# Patient Record
Sex: Female | Born: 1985 | Marital: Single | Smoking: Never smoker
Health system: Southern US, Community
[De-identification: ages and names within clinical notes are randomized; demographics above are authoritative.]

## PROBLEM LIST (undated history)

## (undated) DIAGNOSIS — F419 Anxiety disorder, unspecified: Secondary | ICD-10-CM

## (undated) DIAGNOSIS — F509 Eating disorder, unspecified: Secondary | ICD-10-CM

## (undated) DIAGNOSIS — J45909 Unspecified asthma, uncomplicated: Secondary | ICD-10-CM

## (undated) HISTORY — DX: Anxiety disorder, unspecified: F41.9

## (undated) HISTORY — DX: Eating disorder, unspecified: F50.9

## (undated) HISTORY — PX: LABRAL REPAIR: SHX5172

## (undated) HISTORY — DX: Unspecified asthma, uncomplicated: J45.909

---
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
## 2007-06-07 LAB — LIPID PANEL
HDL: 60 mg/dL (ref 35–70)
LDL Cholesterol: 85 mg/dL
Triglycerides: 42 mg/dL (ref 40–160)

## 2012-05-02 ENCOUNTER — Encounter: Payer: Self-pay | Admitting: Family Medicine

## 2012-05-02 ENCOUNTER — Ambulatory Visit (INDEPENDENT_AMBULATORY_CARE_PROVIDER_SITE_OTHER): Payer: PRIVATE HEALTH INSURANCE | Admitting: Family Medicine

## 2012-05-02 VITALS — BP 101/70 | HR 60 | Temp 98.7°F | Ht 61.0 in | Wt 106.2 lb

## 2012-05-02 DIAGNOSIS — F419 Anxiety disorder, unspecified: Secondary | ICD-10-CM

## 2012-05-02 DIAGNOSIS — F509 Eating disorder, unspecified: Secondary | ICD-10-CM

## 2012-05-02 DIAGNOSIS — J45909 Unspecified asthma, uncomplicated: Secondary | ICD-10-CM | POA: Insufficient documentation

## 2012-05-02 DIAGNOSIS — F411 Generalized anxiety disorder: Secondary | ICD-10-CM

## 2012-05-02 NOTE — Progress Notes (Signed)
  Subjective:    Patient ID: Angel Benitez, female    DOB: [birth year], 26 y.o.   MRN: [id number]  HPI New to establish.  Recently moved.  GYN- [facility]  Birth Control- currently taking OCPs, has appt next week for Mirena.  Anxiety- chronic problem, currently on Zoloft (started in March 2013).  Was having relationship stressors, job stressors.  These situations have improved and pt doesn't want to be chronically on meds.  Has tendency to get 'very overwhelmed at times'.  Has some OCD tendencies w/ food and exercise.  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  Does not have scale in the house due to OCD tendencies.  Feels sxs are well controlled.  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  Had RAD, using Albuterol daily, on Flovent.  Typically only flares when ill.   Review of Systems For ROS see HPI     Objective:   Physical Exam  Vitals reviewed. Constitutional: She is oriented to person, place, and time. She appears well-developed and well-nourished. No distress.  HENT:  Head: Normocephalic and atraumatic.  Neck: Normal range of motion. Neck supple. No thyromegaly present.  Cardiovascular: Normal rate, regular rhythm, normal heart sounds and intact distal pulses.   No murmur heard. Pulmonary/Chest: Effort normal and breath sounds normal. No respiratory distress. She has no wheezes. She has no rales.  Lymphadenopathy:    She has no cervical adenopathy.  Neurological: She is alert and oriented to person, place, and time.  Skin: Skin is warm and dry.  Psychiatric: She has a normal mood and affect. Her behavior is normal. Judgment and thought content normal.          Assessment & Plan:

## 2012-05-02 NOTE — Assessment & Plan Note (Signed)
New to provider, chronic for pt.  Currently on Zoloft.  Discussed weaning.  Since pt is getting IUD next week and will stop her OCPs, it is best to wait until her hormones normalize before decreasing dose.  Pt in agreement w/ this.  Will start 50mg  after New Year w/ plan to wean off.  Will follow.

## 2012-05-02 NOTE — Patient Instructions (Addendum)
Schedule your complete physical for Feb Plan on decreasing your Zoloft to 1/2 tab (50 mg) the first of the year Call with any questions or concerns Welcome!  We're glad to have you!

## 2012-05-02 NOTE — Assessment & Plan Note (Signed)
New to provider.  Chronic for pt.  Admits that she is 'better' but continues to struggle w/ body image, food, and weight.  Pt to inform me if sxs worsen- will enlist help of counseling prn.  Pt expressed understanding and is in agreement w/ plan.

## 2012-05-02 NOTE — Assessment & Plan Note (Signed)
New to provider.  Pt had exercise induced asthma as a child but has outgrown this for the most part.  Had severe flare of RAD due to illness over the winter but is not currently requiring any inhaler use.  Will continue to follow and tx prn.  Pt expressed understanding and is in agreement w/ plan.

## 2012-09-17 ENCOUNTER — Encounter: Payer: PRIVATE HEALTH INSURANCE | Admitting: Family Medicine

## 2012-10-04 ENCOUNTER — Ambulatory Visit (INDEPENDENT_AMBULATORY_CARE_PROVIDER_SITE_OTHER): Payer: PRIVATE HEALTH INSURANCE | Admitting: Family Medicine

## 2012-10-04 ENCOUNTER — Encounter: Payer: Self-pay | Admitting: Family Medicine

## 2012-10-04 VITALS — BP 117/70 | HR 42 | Ht 61.0 in | Wt 113.0 lb

## 2012-10-04 DIAGNOSIS — E785 Hyperlipidemia, unspecified: Secondary | ICD-10-CM | POA: Insufficient documentation

## 2012-10-04 NOTE — Progress Notes (Signed)
CC: Angel Benitez is a 27 y.o. 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  History of anxiety: This was at its worst one year ago while she was living with her mother and working in a job she disliked and while her boyfriend was many states away. She describes her symptoms solely as anxiousness that was moderate in severity everyday the week. She was put on Zoloft but do to a recent move to [place] with her boyfriend she tapered herself off of it about a month ago. She is quite happy with her living situation right now and denies any anxiety. She describes herself as a perfectionist but denies symptoms that would classify her as having excessive compulsive disorder. She denies depression, paranoia, mental disturbance.  History of hyperlipidemia: She's unsure what her most recent panel revealed however she was once told that her cholesterol is elevated, fortunately she was able to control this with diet and exercise alone. She describes herself as an exercise freak. She exercises everyday of the week.  She has a family history of type 2 diabetes: She does not believe she's never been screened for diabetes. She denies polyphagia polyuria nor polydipsia   Review of Systems - General ROS: negative for - chills, fever, night sweats, weight gain or weight loss Ophthalmic ROS: negative for - decreased vision Psychological ROS: negative for - anxiety or depression ENT ROS: negative for - hearing change, nasal congestion, tinnitus or allergies Hematological and Lymphatic ROS: negative for - bleeding problems, bruising or swollen lymph nodes Breast ROS: negative Respiratory ROS: no cough, shortness of breath, or wheezing Cardiovascular ROS: no chest pain or dyspnea on exertion Gastrointestinal ROS: no abdominal pain, change in bowel habits, or black or bloody  stools Genito-Urinary ROS: negative for - genital discharge, genital ulcers, incontinence or abnormal bleeding from genitals Musculoskeletal ROS: negative for - joint pain or muscle pain Neurological ROS: negative for - headaches or memory loss Dermatological ROS: negative for lumps, mole changes, rash and skin lesion changes  Past Medical History  Diagnosis Date  . Asthma   . Depression   . Eating disorder      Family History  Problem Relation Age of Onset  . Alcohol abuse Father   . Drug abuse Father   . Drug abuse Maternal Aunt   . Alcohol abuse Maternal Aunt   . Mental illness Maternal Aunt   . Breast cancer Maternal Grandmother   . Diabetes Maternal Grandmother   . Heart disease Paternal Grandfather      History  Substance Use Topics  . Smoking status: Never Smoker   . Smokeless tobacco: Not on file  . Alcohol Use: Yes     Comment: occasionally     Objective: Filed Vitals:   10/04/12 0849  BP: 117/70  Pulse: 42    General: Alert and Oriented, No Acute Distress HEENT: Pupils equal, round, reactive to light. Conjunctivae clear.  External ears unremarkable, canals clear with intact TMs with appropriate landmarks.  Middle ear appears open without effusion. Pink inferior turbinates.  Moist mucous membranes, pharynx without inflammation nor lesions.  Neck supple without palpable lymphadenopathy nor abnormal masses. Lungs: Clear to auscultation bilaterally, no wheezing/ronchi/rales.  Comfortable work of breathing. Good air movement. Cardiac: Regular rate and rhythm. Normal S1/S2.  No murmurs, rubs, nor gallops.   Extremities: No peripheral edema.  Strong peripheral pulses.  Mental Status:  No depression, anxiety, nor agitation. Skin: Warm and dry.  Assessment & Plan: Bobbe was seen today for establish care.  Diagnoses and associated orders for this visit:  Hyperlipidemia LDL goal < 160 - Lipid panel  Anxiety  Diabetes mellitus screening - BASIC METABOLIC PANEL  WITH GFR  Other Orders  Anxiety: Controlled, continue daily exercise routine, discussed returning at her convenience should symptoms of anxiety return. Hyperlipidemia: Currently controlled however due for fasting lipid panel. We'll screen for type 2 diabetes basic metabolic panel    Return in about 3 months (around 01/01/2013).

## 2012-10-24 ENCOUNTER — Encounter: Payer: Self-pay | Admitting: Family Medicine

## 2012-10-24 DIAGNOSIS — J45909 Unspecified asthma, uncomplicated: Secondary | ICD-10-CM

## 2012-10-25 ENCOUNTER — Encounter: Payer: Self-pay | Admitting: *Deleted

## 2012-11-08 ENCOUNTER — Encounter: Payer: Self-pay | Admitting: Family Medicine

## 2012-11-26 ENCOUNTER — Encounter: Payer: PRIVATE HEALTH INSURANCE | Admitting: Family Medicine

## 2012-12-10 ENCOUNTER — Encounter: Payer: Self-pay | Admitting: Sports Medicine

## 2012-12-10 ENCOUNTER — Ambulatory Visit (INDEPENDENT_AMBULATORY_CARE_PROVIDER_SITE_OTHER): Payer: PRIVATE HEALTH INSURANCE | Admitting: Sports Medicine

## 2012-12-10 VITALS — BP 113/70 | HR 61 | Wt 110.0 lb

## 2012-12-10 DIAGNOSIS — R112 Nausea with vomiting, unspecified: Secondary | ICD-10-CM

## 2012-12-10 DIAGNOSIS — R1013 Epigastric pain: Secondary | ICD-10-CM

## 2012-12-10 LAB — COMPREHENSIVE METABOLIC PANEL WITH GFR
ALT: 31 U/L (ref 0–35)
Albumin: 5 g/dL (ref 3.5–5.2)
CO2: 28 meq/L (ref 19–32)
Calcium: 9.7 mg/dL (ref 8.4–10.5)
Chloride: 100 meq/L (ref 96–112)
Glucose, Bld: 86 mg/dL (ref 70–99)
Sodium: 138 meq/L (ref 135–145)
Total Protein: 7.2 g/dL (ref 6.0–8.3)

## 2012-12-10 LAB — LIPID PANEL
LDL Cholesterol: 78 mg/dL (ref 0–99)
VLDL: 8 mg/dL (ref 0–40)

## 2012-12-10 LAB — BASIC METABOLIC PANEL WITH GFR
Chloride: 102 mEq/L (ref 96–112)
Creat: 1.12 mg/dL — ABNORMAL HIGH (ref 0.50–1.10)
GFR, Est Non African American: 68 mL/min
Potassium: 4.7 mEq/L (ref 3.5–5.3)

## 2012-12-10 LAB — CBC WITH DIFFERENTIAL/PLATELET
Basophils Absolute: 0 10*3/uL (ref 0.0–0.1)
Basophils Relative: 1 % (ref 0–1)
Eosinophils Absolute: 0 K/uL (ref 0.0–0.7)
Eosinophils Relative: 1 % (ref 0–5)
HCT: 44.1 % (ref 36.0–46.0)
Hemoglobin: 14.4 g/dL (ref 12.0–15.0)
Lymphocytes Relative: 24 % (ref 12–46)
Lymphs Abs: 1 K/uL (ref 0.7–4.0)
MCH: 28.6 pg (ref 26.0–34.0)
MCHC: 32.7 g/dL (ref 30.0–36.0)
MCV: 87.7 fL (ref 78.0–100.0)
Monocytes Absolute: 0.4 10*3/uL (ref 0.1–1.0)
Monocytes Relative: 10 % (ref 3–12)
Neutro Abs: 2.8 10*3/uL (ref 1.7–7.7)
Neutrophils Relative %: 66 % (ref 43–77)
Platelets: 205 K/uL (ref 150–400)
RBC: 5.03 MIL/uL (ref 3.87–5.11)
RDW: 14.3 % (ref 11.5–15.5)
WBC: 4.2 K/uL (ref 4.0–10.5)

## 2012-12-10 LAB — COMPREHENSIVE METABOLIC PANEL
AST: 52 U/L — ABNORMAL HIGH (ref 0–37)
Alkaline Phosphatase: 69 U/L (ref 39–117)
BUN: 24 mg/dL — ABNORMAL HIGH (ref 6–23)
Creat: 0.95 mg/dL (ref 0.50–1.10)
Potassium: 4.5 mEq/L (ref 3.5–5.3)
Total Bilirubin: 0.6 mg/dL (ref 0.3–1.2)

## 2012-12-10 LAB — URINALYSIS
Glucose, UA: NEGATIVE mg/dL
Hgb urine dipstick: NEGATIVE
Ketones, ur: 15 mg/dL — AB
Leukocytes, UA: NEGATIVE
Nitrite: NEGATIVE
Protein, ur: NEGATIVE mg/dL
Specific Gravity, Urine: 1.02 (ref 1.005–1.030)
Urobilinogen, UA: 0.2 mg/dL (ref 0.0–1.0)
pH: 6 (ref 5.0–8.0)

## 2012-12-10 LAB — PREGNANCY, URINE: Preg Test, Ur: NEGATIVE

## 2012-12-10 LAB — LIPASE: Lipase: 51 U/L (ref 0–75)

## 2012-12-10 MED ORDER — ONDANSETRON 8 MG PO TBDP: 8.0000 mg | ORAL_TABLET | Freq: Three times a day (TID) | ORAL | Status: DC | PRN

## 2012-12-10 MED ORDER — PROMETHAZINE HCL 25 MG PO TABS: 25.0000 mg | ORAL_TABLET | Freq: Four times a day (QID) | ORAL | Status: DC | PRN

## 2012-12-10 MED ORDER — TRAMADOL HCL 50 MG PO TABS: 50.0000 mg | ORAL_TABLET | Freq: Three times a day (TID) | ORAL | Status: DC | PRN

## 2012-12-10 MED ORDER — PROMETHAZINE HCL 25 MG/ML IJ SOLN
25.0000 mg | Freq: Once | INTRAMUSCULAR | Status: AC
  Administered 2012-12-10: 25 mg via INTRAMUSCULAR

## 2012-12-10 NOTE — Progress Notes (Signed)
  Subjective:    CC: Nausea  HPI: This is a very pleasant 27 year old female, she is the wife of one of the family medicine residents at [facility].  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  She's had several weeks of epigastric abdominal pain, nausea, single episode of vomiting, and mild diarrhea. Overall symptoms are getting better, but her epigastric pain has been persistent. It's fairly random, and not associated with food. Unfortunately, she has decreased her oral fluid intake and her oral food intake. She is still urinating, but is very dark. Pain is localized, does not radiate, moderate. She denies any fevers, chills, or upper respiratory symptoms. Denies any sick contacts. She does have Mirena in place.  Past medical history, Surgical history, Family history not pertinant except as noted below, Social history, Allergies, and medications have been entered into the medical record, reviewed, and no changes needed.   Review of Systems: No fevers, chills, night sweats, weight loss, chest pain, or shortness of breath.   Objective:    General: Well Developed, well nourished, and in no acute distress.  Neuro: Alert and oriented x3, extra-ocular muscles intact, sensation grossly intact.  HEENT: Normocephalic, atraumatic, pupils equal round reactive to light, neck supple, no masses, no lymphadenopathy, thyroid nonpalpable. Oropharynx shows dry mucous membranes, nasopharynx, external ear canals unremarkable. Skin: Warm and dry, no rashes. Cardiac: Regular rate and rhythm, no murmurs rubs or gallops, no lower extremity edema.  Respiratory: Clear to auscultation bilaterally. Not using accessory muscles, speaking in full sentences. Abdomen:  Soft, mildly tender in the epigastrium, no rigidity, no rebound pain, normal bowel sounds.    Promethazine 25 mg intramuscular given. Impression and Recommendations:

## 2012-12-10 NOTE — Assessment & Plan Note (Signed)
This is acute likely related to viral gastroenteritis. Phenergan 25 mg intramuscular. CBC with differential, CMET, lipase, urinalysis, urine hCG. Abdominal exam is fairly benign, I don't think we need a CT scan. Zofran ODT, Phenergan oral to go home with, tramadol for pain. Return if no better in one week.

## 2012-12-12 ENCOUNTER — Telehealth: Payer: Self-pay | Admitting: *Deleted

## 2012-12-12 NOTE — Telephone Encounter (Signed)
Pt called and states she received a phone call from Dr. Ivan Anchors about lab results.Initially I advised that I couldn't give her the results because the doctor had not technically signed off on it. Pt became furious and was upset because she called back for results and now she's "freaking out" because no one has the results and knows what's going on. She asked if I could just give her the numbers. 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 Karie Schwalbe to look at her Lipid profile and gave her her numbers after he reviewed it.

## 2013-02-27 ENCOUNTER — Telehealth: Payer: Self-pay | Admitting: Family Medicine

## 2013-02-28 ENCOUNTER — Telehealth: Payer: Self-pay | Admitting: Family Medicine

## 2013-02-28 NOTE — Telephone Encounter (Signed)
Pt called yesterday upset because she was charged for initial visit. Explained to her that copays are collected for new patient visits but not for cpe's. She said that she  mentioned to Dr that she had been treated in the past for Anxiety but was charged for it...wants to speak to Dr about this.

## 2013-04-01 ENCOUNTER — Encounter: Payer: PRIVATE HEALTH INSURANCE | Admitting: Family Medicine

## 2013-05-05 ENCOUNTER — Encounter: Payer: PRIVATE HEALTH INSURANCE | Admitting: Family Medicine

## 2013-05-12 ENCOUNTER — Encounter: Payer: PRIVATE HEALTH INSURANCE | Admitting: Family Medicine

## 2013-06-19 ENCOUNTER — Other Ambulatory Visit: Payer: Self-pay

## 2013-10-28 ENCOUNTER — Encounter: Payer: PRIVATE HEALTH INSURANCE | Admitting: Family Medicine

## 2013-12-23 ENCOUNTER — Encounter: Payer: PRIVATE HEALTH INSURANCE | Admitting: Family Medicine

## 2014-06-29 ENCOUNTER — Encounter: Payer: Self-pay | Admitting: Sports Medicine

## 2014-06-29 ENCOUNTER — Ambulatory Visit (INDEPENDENT_AMBULATORY_CARE_PROVIDER_SITE_OTHER): Payer: PRIVATE HEALTH INSURANCE | Admitting: Sports Medicine

## 2014-06-29 VITALS — BP 106/64 | Ht 61.0 in | Wt 120.0 lb

## 2014-06-29 DIAGNOSIS — M25552 Pain in left hip: Secondary | ICD-10-CM

## 2014-06-29 NOTE — Progress Notes (Signed)
   Subjective:    Patient ID: Angel Benitez, female    DOB: 10-Jul-1986, 28 y.o.   MRN: [id number]  HPI chief complaint: Left hip pain  Very pleasant 28 year old runner comes in today complaining of 6-7 weeks of left hip pain. Pain began insidiously while training for a marathon. She began to initially experience left groin pain which then became more diffuse around the hip. Pain then began to radiate down the left thigh. Positive "C" sign.She stopped running for 3 weeks and as a result her pain resolved. She started back running a few miles last week and her pain began to return. No pain with walking. Pain primarily with running. She is currently localizing most of her pain to her groin. No numbness or tingling. No history of previous stress fracture in her hip but she has had stress fractures in her left foot. She is not experiencing any pain at rest. She is hoping to run the [employer] [place] in January. 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 She has a surgical history significant for left ankle arthroscopy in 2007 for loose body removal. She also has a surgical history significant for left shoulder labral repair done in March of this year at [facility]. No prior hip surgeries. No low back pain. Past medical history reviewed Surgical history reviewed Medications reviewed Allergies reviewed Social history reviewed    Review of Systems     Objective:   Physical Exam Well-developed, fit appearing. No acute distress. Awake alert and oriented 3. Vital signs reviewed.  Left hip: Full active and passive range of motion.Positive FADIR. Mild pain with resisted hip flexion but not markedly. No bony or soft tissue tenderness to direct palpation. No pain with resisted hip abduction or adduction. Negative Thomas test. Positive hop test. Neurovascular intact distally. Walking without a limp.       Assessment & Plan:  Left hip pain worrisome for femoral neck  stress fracture-rule out femoral acetabular impingement   I will start with getting plain x-rays of her left hip. If unremarkable. I think we need to pursue an MRI arthrogram to rule out a femoral neck stress reaction or stress fracture as well as femoral acetabular impingement/ labral tear. Patient may continue to exercise as tolerated using pain as her guide but I've warned her about trying to push through pain while running. Unfortunately, I'm not sure that she will be able to get in the training necessary for her upcoming marathon in January. I will follow-up with her via telephone with the results of her x-rays and MRI arthrogram once available at which point we will delineate more definitive treatment.

## 2014-06-29 NOTE — Patient Instructions (Addendum)
MRI L HIP [facility] [address] MON 07/13/14 @ 11A ARRIVAL TIME IS 1045A [phone]  XRAYS CAN BE DONE ANYTIME WITHOUT AN APPT

## 2014-07-01 ENCOUNTER — Ambulatory Visit (HOSPITAL_COMMUNITY)
Admission: RE | Admit: 2014-07-01 | Discharge: 2014-07-01 | Disposition: A | Discharge status: Home / Self Care | Payer: PRIVATE HEALTH INSURANCE | Source: Ambulatory Visit | Attending: Sports Medicine | Admitting: Sports Medicine

## 2014-07-01 DIAGNOSIS — R1030 Lower abdominal pain, unspecified: Secondary | ICD-10-CM | POA: Diagnosis not present

## 2014-07-01 DIAGNOSIS — M25552 Pain in left hip: Secondary | ICD-10-CM | POA: Diagnosis not present

## 2014-07-10 ENCOUNTER — Other Ambulatory Visit (HOSPITAL_COMMUNITY): Payer: PRIVATE HEALTH INSURANCE

## 2014-07-10 ENCOUNTER — Ambulatory Visit (HOSPITAL_COMMUNITY): Payer: PRIVATE HEALTH INSURANCE

## 2014-07-13 ENCOUNTER — Ambulatory Visit (HOSPITAL_COMMUNITY)
Admission: RE | Admit: 2014-07-13 | Discharge: 2014-07-13 | Disposition: A | Discharge status: Home / Self Care | Payer: PRIVATE HEALTH INSURANCE | Source: Ambulatory Visit | Attending: Sports Medicine | Admitting: Sports Medicine

## 2014-07-13 DIAGNOSIS — M25552 Pain in left hip: Secondary | ICD-10-CM | POA: Insufficient documentation

## 2014-07-13 MED ORDER — IOHEXOL 300 MG/ML  SOLN
50.0000 mL | Freq: Once | INTRAMUSCULAR | Status: AC | PRN
  Administered 2014-07-13: 10 mL

## 2014-07-17 ENCOUNTER — Telehealth: Payer: Self-pay | Admitting: Sports Medicine

## 2014-07-17 NOTE — Telephone Encounter (Signed)
Melina SchoolsRobyn comes in today at my request to go over her MRI arthrogram results of her left hip. Arthrogram is unremarkable. No evidence of stress fracture. No signs of labral tear. No signs of acetabular impingement. It is a normal study. She is currently asymptomatic. I asked her to come in today so that I could evaluate her running gait and reevaluate her hip strength. Her pain was previously in her groin. She has had IT band issues with her left knee in the past as well. She tells me that prior to her hip pain she changed running shoes and is questioning whether or not that may be contributing to her symptoms.  Physical exam: Left hip shows full painless hip range of motion. Negative logroll. Neg FADIR. She does have moderate weakness with both hip flexion and hip abduction bilaterally. No leg length discrepancy. Iliotibial band does not appear to be tight. In evaluating her running form she appears to be a midfoot striker and internally rotates both feet right greater than left. She may supinate slightly. Examination of her feet show a fairly neutral longitudinal arch and a collapsed transverse arch but no significant callus across the metatarsal heads.  Given her normal MRI arthrogram I have cleared Nayomi to return to running. I've given her a comprehensive hip strengthening program as well as some iliopsoas stretching. She will also start doing line drills at a local track twice a week. She is going to change back to her original running shoes Shon Baton(Brooks) and I've asked her to return to the office with those shoes sometime the next couple of weeks. I would also like to get Dr.Fields opinion.

## 2015-01-27 ENCOUNTER — Encounter: Payer: Self-pay | Admitting: Gastroenterology

## 2015-01-28 ENCOUNTER — Telehealth: Payer: Self-pay

## 2015-01-28 NOTE — Telephone Encounter (Signed)
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 Can you offer next Tuesday afternoon (double book) or Wednesday July 13 afternoon (double book).  Thanks

## 2015-01-28 NOTE — Telephone Encounter (Signed)
Pt has epigastric pain that wakes her up at night.  Scheduled for 02/24/15 with Dr Christella Hartigan

## 2015-01-28 NOTE — Telephone Encounter (Signed)
Left message on machine to call back  

## 2015-02-02 ENCOUNTER — Encounter: Payer: Self-pay | Admitting: Gastroenterology

## 2015-02-02 ENCOUNTER — Ambulatory Visit (INDEPENDENT_AMBULATORY_CARE_PROVIDER_SITE_OTHER): Payer: PRIVATE HEALTH INSURANCE | Admitting: Gastroenterology

## 2015-02-02 ENCOUNTER — Other Ambulatory Visit (INDEPENDENT_AMBULATORY_CARE_PROVIDER_SITE_OTHER): Payer: PRIVATE HEALTH INSURANCE

## 2015-02-02 VITALS — BP 110/64 | HR 60 | Ht 60.63 in | Wt 126.5 lb

## 2015-02-02 DIAGNOSIS — R1013 Epigastric pain: Secondary | ICD-10-CM

## 2015-02-02 LAB — CBC WITH DIFFERENTIAL/PLATELET
BASOS ABS: 0 10*3/uL (ref 0.0–0.1)
Basophils Relative: 0.8 % (ref 0.0–3.0)
Eosinophils Absolute: 0.2 10*3/uL (ref 0.0–0.7)
Eosinophils Relative: 3.2 % (ref 0.0–5.0)
HEMATOCRIT: 39.9 % (ref 36.0–46.0)
Hemoglobin: 13.4 g/dL (ref 12.0–15.0)
Lymphocytes Relative: 27.8 % (ref 12.0–46.0)
Lymphs Abs: 1.6 10*3/uL (ref 0.7–4.0)
MCHC: 33.6 g/dL (ref 30.0–36.0)
MCV: 87.4 fl (ref 78.0–100.0)
MONO ABS: 0.5 10*3/uL (ref 0.1–1.0)
Monocytes Relative: 8.9 % (ref 3.0–12.0)
Neutro Abs: 3.3 10*3/uL (ref 1.4–7.7)
Neutrophils Relative %: 59.3 % (ref 43.0–77.0)
Platelets: 235 10*3/uL (ref 150.0–400.0)
RBC: 4.56 Mil/uL (ref 3.87–5.11)
RDW: 13.7 % (ref 11.5–15.5)
WBC: 5.6 10*3/uL (ref 4.0–10.5)

## 2015-02-02 LAB — COMPREHENSIVE METABOLIC PANEL
ALT: 24 U/L (ref 0–35)
AST: 30 U/L (ref 0–37)
Albumin: 4.4 g/dL (ref 3.5–5.2)
Alkaline Phosphatase: 67 U/L (ref 39–117)
BILIRUBIN TOTAL: 0.5 mg/dL (ref 0.2–1.2)
BUN: 19 mg/dL (ref 6–23)
CALCIUM: 9.7 mg/dL (ref 8.4–10.5)
CO2: 31 mEq/L (ref 19–32)
Chloride: 103 mEq/L (ref 96–112)
Creatinine, Ser: 0.8 mg/dL (ref 0.40–1.20)
GFR: 90.49 mL/min (ref >60.00)
Glucose, Bld: 89 mg/dL (ref 70–99)
POTASSIUM: 4.2 meq/L (ref 3.5–5.1)
Sodium: 138 mEq/L (ref 135–145)
TOTAL PROTEIN: 6.9 g/dL (ref 6.0–8.3)

## 2015-02-02 LAB — H. PYLORI ANTIBODY, IGG: H PYLORI IGG: NEGATIVE

## 2015-02-02 NOTE — Patient Instructions (Addendum)
You will be set up for an ultrasound for your epigastric pains. You will have labs checked today in the basement lab.  Please head down after you check out with the front desk  (cbc, cmet, urine pregnancy test, h. Pylori serology). Ultrasound at [facility] 02/05/2015 at 10:30 am. Arrive at 10:15 am. Nothing to eat or drink after Midnight the night before.

## 2015-02-02 NOTE — Progress Notes (Signed)
HPI: This is a very pleasant 29 year old woman  who called herself to make this appointment. 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  Chief complaint is epigastric pain  A week ago she had dinner, her usual healthy fare.  After some time epigastric abd pain. Took tums, took zantac without much improvement. Awoke that night with worsening pains.  She took oxycodone and it helped briefly but she awoken again. By the morning she felt overall much better. Was not particularly nauseas at the time.  Never took  Infrequent NSAIDs, she used to a long time ago.  Stable weight overall.  Her bowels seem to react to stress, looser with stress.  Her husband is FP MD, sports med.  Used to live in [place]   Clinically a bit lactose intolerant.  Review of systems: Pertinent positive and negative review of systems were noted in the above HPI section. Complete review of systems was performed and was otherwise normal.   Past Medical History  Diagnosis Date  . Asthma   . Anxiety   . Eating disorder     Past Surgical History  Procedure Laterality Date  . Umbilical hernia repair  2007  . Ankle surgery Left 2007  . Labral repair Left     Current Outpatient Prescriptions  Medication Sig Dispense Refill  . Calcium Carb-Cholecalciferol (CALCIUM-VITAMIN D) 600-400 MG-UNIT TABS Take 1 tablet by mouth daily.    Marland Kitchen levonorgestrel (MIRENA) 20 MCG/24HR IUD 1 each by Intrauterine route once. Placed Fall 2013     No current facility-administered medications for this visit.    Allergies as of 02/02/2015 - Review Complete 02/02/2015  Allergen Reaction Noted  . Dairy aid [lactase]  02/02/2015  . Mold extract [trichophyton]  02/02/2015  . Penicillins  05/02/2012    Family History  Problem Relation Age of Onset  . Alcohol abuse Father   . Drug abuse Father   . Drug abuse Maternal Aunt   . Alcohol abuse Maternal Aunt   . Mental illness Maternal Aunt   . Breast cancer Maternal  Grandmother   . Diabetes Maternal Grandmother   . Heart disease Paternal Grandfather   . Colon cancer Other     great grandfather  . Colon polyps Mother     ?  Marland Kitchen Irritable bowel syndrome Father     History   Social History  . Marital Status: Single    Spouse Name: N/A  . Number of Children: 0  . Years of Education: N/A   Occupational History  . physicians assistant    Social History Main Topics  . Smoking status: Never Smoker   . Smokeless tobacco: Never Used  . Alcohol Use: 0.0 oz/week    0 Standard drinks or equivalent per week     Comment: occasionally  . Drug Use: No  . Sexual Activity: Not on file   Other Topics Concern  . Not on file   Social History Narrative     Physical Exam: BP 110/64 mmHg  Pulse 60  Ht 5' 0.63" (1.54 m)  Wt 126 lb 8 oz (57.38 kg)  BMI 24.19 kg/m2 Constitutional: generally well-appearing Psychiatric: alert and oriented x3 Eyes: extraocular movements intact Mouth: oral pharynx moist, no lesions Neck: supple no lymphadenopathy Cardiovascular: heart regular rate and rhythm Lungs: clear to auscultation bilaterally Abdomen: soft, nontender, nondistended, no obvious ascites, no peritoneal signs, normal bowel sounds Extremities: no lower extremity edema bilaterally Skin: no lesions on visible extremities   Assessment  and plan: 29 y.o. female with  an episode of significant epigastric pain about 1 week ago  I am most suspicious of biliary colic, underlying gallbladder disease. She is probably at increased risk for H. pylori infection since she works in the emergency room but she has no other risk factors for ulcer disease and so I would put that a bit lower on the list of possibilities here. I would like to proceed with blood tests and imaging studies including a CBC, complete metabolic profile, urine pregnancy test, abdominal ultrasound. If the ultrasound shows clear gallstones disease and I would likely refer her to a general surgeon to  consider cholecystectomy. If the ultrasound and lab tests are otherwise unrevealing then I think upper endoscopy is the next step in evaluation.   Rob Bunting, MD [facility] 02/02/2015, 1:32 PM  Cc: Laren Boom, DO

## 2015-02-03 ENCOUNTER — Ambulatory Visit (HOSPITAL_COMMUNITY)
Admission: RE | Admit: 2015-02-03 | Discharge: 2015-02-03 | Disposition: A | Discharge status: Home / Self Care | Payer: PRIVATE HEALTH INSURANCE | Source: Ambulatory Visit | Attending: Gastroenterology | Admitting: Gastroenterology

## 2015-02-03 DIAGNOSIS — R109 Unspecified abdominal pain: Secondary | ICD-10-CM | POA: Insufficient documentation

## 2015-02-03 DIAGNOSIS — R1013 Epigastric pain: Secondary | ICD-10-CM

## 2015-02-03 LAB — PREGNANCY, URINE: Preg Test, Ur: NEGATIVE

## 2015-02-05 ENCOUNTER — Ambulatory Visit (HOSPITAL_COMMUNITY): Payer: PRIVATE HEALTH INSURANCE

## 2015-02-08 ENCOUNTER — Ambulatory Visit (AMBULATORY_SURGERY_CENTER): Payer: Self-pay | Admitting: *Deleted

## 2015-02-08 VITALS — Ht 60.5 in | Wt 126.0 lb

## 2015-02-08 DIAGNOSIS — G8929 Other chronic pain: Secondary | ICD-10-CM

## 2015-02-08 DIAGNOSIS — R1013 Epigastric pain: Secondary | ICD-10-CM

## 2015-02-08 NOTE — Progress Notes (Signed)
No egg or soy allergy. She has had lactose intolerance No issues with past sedation No diet pills No home 02 use No emmi video per pt.  Pt absolutely refused to get on the scale for a weight. She states she was just weighed a week ago in the office at 126lb and she has anxiety about scales and refused to weigh.

## 2015-02-17 ENCOUNTER — Encounter: Payer: Self-pay | Admitting: Gastroenterology

## 2015-02-17 ENCOUNTER — Ambulatory Visit: Payer: PRIVATE HEALTH INSURANCE | Admitting: Gastroenterology

## 2015-02-17 VITALS — BP 106/58 | HR 42 | Temp 97.3°F | Resp 18 | Ht 60.0 in | Wt 126.0 lb

## 2015-02-17 DIAGNOSIS — K2970 Gastritis, unspecified, without bleeding: Secondary | ICD-10-CM

## 2015-02-17 DIAGNOSIS — R1013 Epigastric pain: Secondary | ICD-10-CM | POA: Diagnosis not present

## 2015-02-17 DIAGNOSIS — R1084 Generalized abdominal pain: Secondary | ICD-10-CM

## 2015-02-17 MED ORDER — SODIUM CHLORIDE 0.9 % IV SOLN: 500.0000 mL | INTRAVENOUS | Status: DC

## 2015-02-17 NOTE — Progress Notes (Signed)
Patient awakening,vss,report to rn 

## 2015-02-17 NOTE — Progress Notes (Signed)
Called to room to assist during endoscopic procedure.  Patient ID and intended procedure confirmed with present staff. Received instructions for my participation in the procedure from the performing physician.  

## 2015-02-17 NOTE — Op Note (Signed)
[facility] [address] [place], [id number]   ENDOSCOPY PROCEDURE REPORT  PATIENT: Angel Benitez, Angel  MR#: [id number] BIRTHDATE: 12-27-1985 , 29  yrs. old GENDER: female ENDOSCOPIST: Rachael Feeaniel P Khristopher Kapaun, MD PROCEDURE DATE:  02/17/2015 PROCEDURE:  EGD w/ biopsy ASA CLASS:     Class I INDICATIONS:  abdominal pain (normal US, cbc, cmet). MEDICATIONS: Monitored anesthesia care and Propofol 150 mg IV TOPICAL ANESTHETIC: none  DESCRIPTION OF PROCEDURE: After the risks benefits and alternatives of the procedure were thoroughly explained, informed consent was obtained.  The LB [id number] endoscope was introduced through the mouth and advanced to the second portion of the duodenum , Without limitations.  The instrument was slowly withdrawn as the mucosa was fully examined.  There was very mild, non-specific distal gastritis.  This was biopsied and sent to pathology.  The examination was othewise normal.  Retroflexed views revealed no abnormalities.     The scope was then withdrawn from the patient and the procedure completed. COMPLICATIONS: There were no immediate complications.  ENDOSCOPIC IMPRESSION: There was very mild, non-specific distal gastritis.  This was biopsied and sent to pathology.  The examination was othewise normal  RECOMMENDATIONS: If biopsies show H.  pylori, you will be started on appropriate antibiotics.  If not, will observe clinically for recurrent significant abd pains and if they occur would consider HIDA scan to check for biliary dyskinesia.  eSigned:  Rachael Feeaniel P Addelyn Alleman, MD 02/17/2015 2:28 PM

## 2015-02-17 NOTE — Progress Notes (Signed)
No problems noted in the recovery room. Maw  Haynes Bastracy Ennis, RN went over discharge instructions with the pt.  maw

## 2015-02-17 NOTE — Progress Notes (Signed)
Recovery phase was began in the procedure room @ 14:27, waiting for a bay to recovery the pt in recovery room. maw

## 2015-02-17 NOTE — Patient Instructions (Signed)
Impressions/recommendations:  Gastritis (handout given) Await pathology results.  YOU HAD AN ENDOSCOPIC PROCEDURE TODAY AT THE [facility]:   Refer to the procedure report that was given to you for any specific questions about what was found during the examination.  If the procedure report does not answer your questions, please call your gastroenterologist to clarify.  If you requested that your care partner not be given the details of your procedure findings, then the procedure report has been included in a sealed envelope for you to review at your convenience later.  YOU SHOULD EXPECT: Some feelings of bloating in the abdomen. Passage of more gas than usual.  Walking can help get rid of the air that was put into your GI tract during the procedure and reduce the bloating. If you had a lower endoscopy (such as a colonoscopy or flexible sigmoidoscopy) you may notice spotting of blood in your stool or on the toilet paper. If you underwent a bowel prep for your procedure, you may not have a normal bowel movement for a few days.  Please Note:  You might notice some irritation and congestion in your nose or some drainage.  This is from the oxygen used during your procedure.  There is no need for concern and it should clear up in a day or so.  SYMPTOMS TO REPORT IMMEDIATELY:   Following lower endoscopy (colonoscopy or flexible sigmoidoscopy):  Excessive amounts of blood in the stool  Significant tenderness or worsening of abdominal pains  Swelling of the abdomen that is new, acute  Fever of 100F or higher   Following upper endoscopy (EGD)  Vomiting of blood or coffee ground material  New chest pain or pain under the shoulder blades  Painful or persistently difficult swallowing  New shortness of breath  Fever of 100F or higher  Black, tarry-looking stools  For urgent or emergent issues, a gastroenterologist can be reached at any hour by calling [phone].   DIET: Your  first meal following the procedure should be a small meal and then it is ok to progress to your normal diet. Heavy or fried foods are harder to digest and may make you feel nauseous or bloated.  Likewise, meals heavy in dairy and vegetables can increase bloating.  Drink plenty of fluids but you should avoid alcoholic beverages for 24 hours.  ACTIVITY:  You should plan to take it easy for the rest of today and you should NOT DRIVE or use heavy machinery until tomorrow (because of the sedation medicines used during the test).    FOLLOW UP: Our staff will call the number listed on your records the next business day following your procedure to check on you and address any questions or concerns that you may have regarding the information given to you following your procedure. If we do not reach you, we will leave a message.  However, if you are feeling well and you are not experiencing any problems, there is no need to return our call.  We will assume that you have returned to your regular daily activities without incident.  If any biopsies were taken you will be contacted by phone or by letter within the next 1-3 weeks.  Please call us at [phone] if you have not heard about the biopsies in 3 weeks.    SIGNATURES/CONFIDENTIALITY: You and/or your care partner have signed paperwork which will be entered into your electronic medical record.  These signatures attest to the fact that that the information  above on your After Visit Summary has been reviewed and is understood.  Full responsibility of the confidentiality of this discharge information lies with you and/or your care-partner.YOU HAD AN ENDOSCOPIC PROCEDURE TODAY AT THE [facility]:   Refer to the procedure report that was given to you for any specific questions about what was found during the examination.  If the procedure report does not answer your questions, please call your gastroenterologist to clarify.  If you requested that your  care partner not be given the details of your procedure findings, then the procedure report has been included in a sealed envelope for you to review at your convenience later.  YOU SHOULD EXPECT: Some feelings of bloating in the abdomen. Passage of more gas than usual.  Walking can help get rid of the air that was put into your GI tract during the procedure and reduce the bloating. If you had a lower endoscopy (such as a colonoscopy or flexible sigmoidoscopy) you may notice spotting of blood in your stool or on the toilet paper. If you underwent a bowel prep for your procedure, you may not have a normal bowel movement for a few days.  Please Note:  You might notice some irritation and congestion in your nose or some drainage.  This is from the oxygen used during your procedure.  There is no need for concern and it should clear up in a day or so.  SYMPTOMS TO REPORT IMMEDIATELY:   Following upper endoscopy (EGD)  Vomiting of blood or coffee ground material  New chest pain or pain under the shoulder blades  Painful or persistently difficult swallowing  New shortness of breath  Fever of 100F or higher  Black, tarry-looking stools  For urgent or emergent issues, a gastroenterologist can be reached at any hour by calling [phone].   DIET: Your first meal following the procedure should be a small meal and then it is ok to progress to your normal diet. Heavy or fried foods are harder to digest and may make you feel nauseous or bloated.  Likewise, meals heavy in dairy and vegetables can increase bloating.  Drink plenty of fluids but you should avoid alcoholic beverages for 24 hours.  ACTIVITY:  You should plan to take it easy for the rest of today and you should NOT DRIVE or use heavy machinery until tomorrow (because of the sedation medicines used during the test).    FOLLOW UP: Our staff will call the number listed on your records the next business day following your procedure to check on  you and address any questions or concerns that you may have regarding the information given to you following your procedure. If we do not reach you, we will leave a message.  However, if you are feeling well and you are not experiencing any problems, there is no need to return our call.  We will assume that you have returned to your regular daily activities without incident.  If any biopsies were taken you will be contacted by phone or by letter within the next 1-3 weeks.  Please call us at [phone] if you have not heard about the biopsies in 3 weeks.    SIGNATURES/CONFIDENTIALITY: You and/or your care partner have signed paperwork which will be entered into your electronic medical record.  These signatures attest to the fact that that the information above on your After Visit Summary has been reviewed and is understood.  Full responsibility of the confidentiality of this discharge  information lies with you and/or your care-partner. 

## 2015-02-18 ENCOUNTER — Telehealth: Payer: Self-pay | Admitting: *Deleted

## 2015-02-18 NOTE — Telephone Encounter (Signed)
Name identifier, left message, follow-up 

## 2015-02-24 ENCOUNTER — Ambulatory Visit: Payer: PRIVATE HEALTH INSURANCE | Admitting: Gastroenterology

## 2015-03-12 ENCOUNTER — Encounter: Payer: PRIVATE HEALTH INSURANCE | Admitting: Gastroenterology

## 2015-04-13 ENCOUNTER — Ambulatory Visit: Payer: PRIVATE HEALTH INSURANCE | Admitting: Gastroenterology

## 2015-06-09 ENCOUNTER — Telehealth: Payer: Self-pay | Admitting: Sports Medicine

## 2015-06-09 NOTE — Telephone Encounter (Signed)
Angel Benitez called me yesterday with a complaint of congestion, cough, and bloody nasal discharge. She has a history of sinusitis/rhinitis/bronchitis with seasonal changes. She usually responds well to azithromycin. I've agreed to prescribe her a Z-Pak with instructions to take as directed. I'll also give her a prescription for Tussionex to take as needed for cough. If symptoms persist she will need to come into the office for a formal evaluation.

## 2017-01-11 IMAGING — US US ABDOMEN COMPLETE
1 series · 14 of 25 positions shown · non-contrast
Comparison: None.

CLINICAL DATA: Abdominal pain .

EXAM:
ULTRASOUND ABDOMEN COMPLETE

[Series 1: us abdomen complete · 0.18mm/px · 14 of 80 slices shown]
[im 1/80]
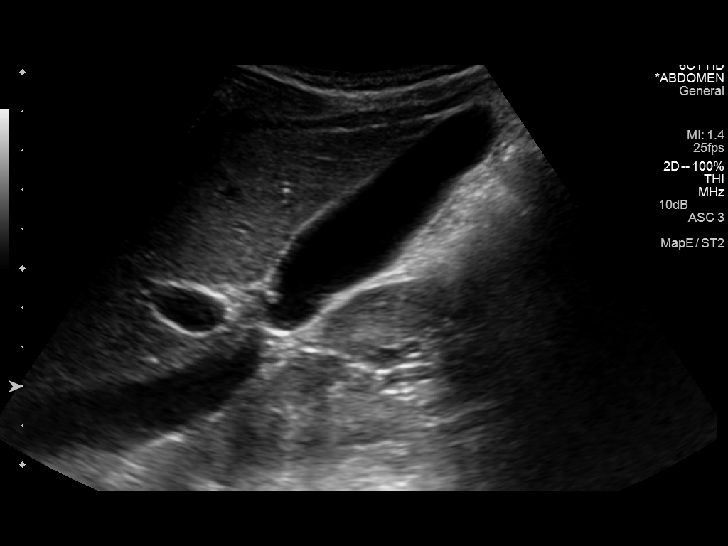
[im 7/80]
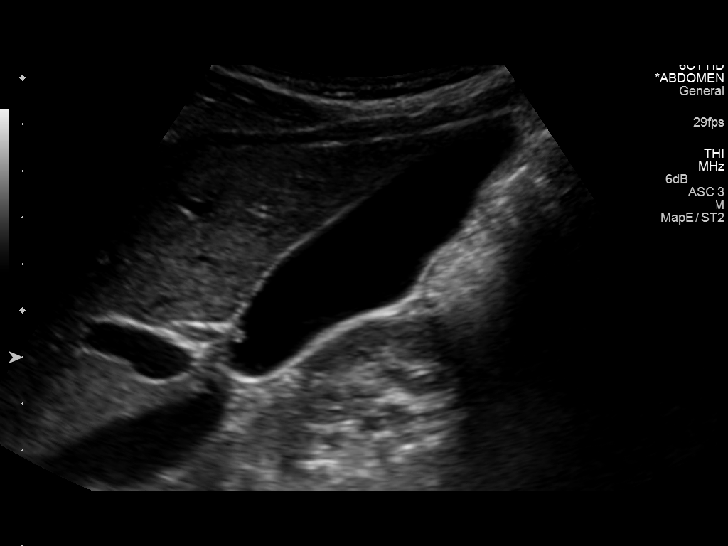
[im 14/80]
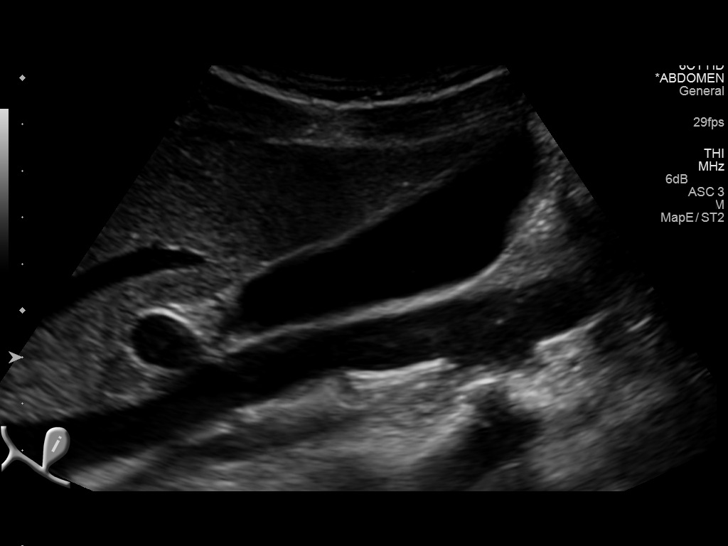
[im 20/80]
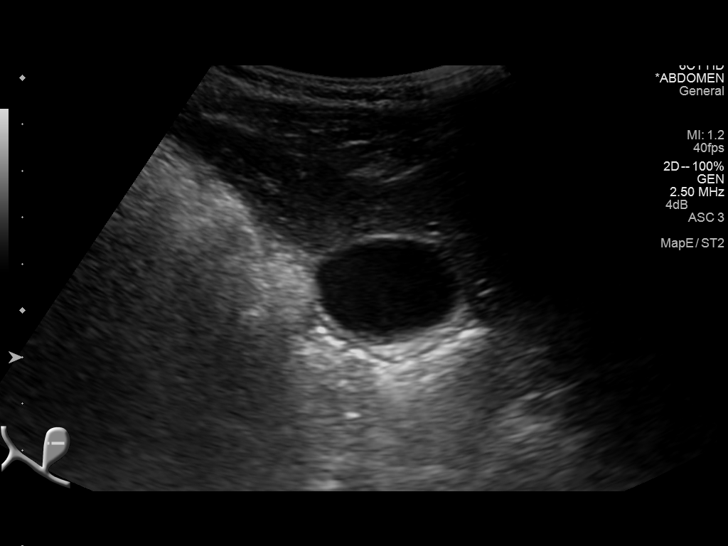
[im 27/80]
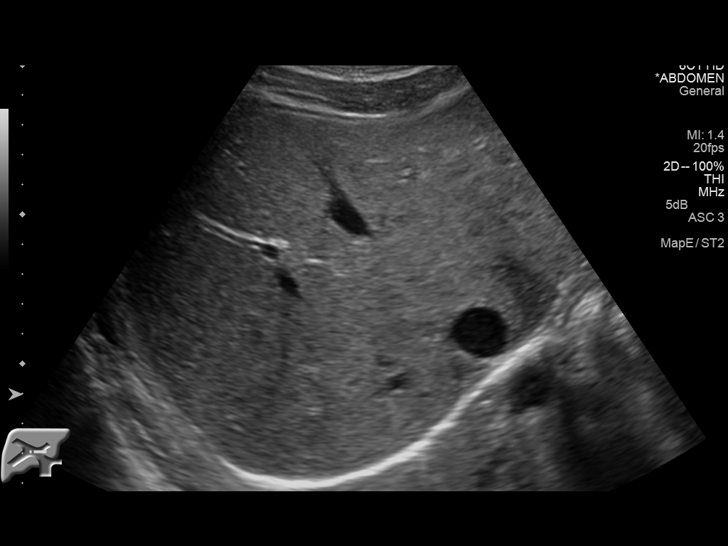
[im 30/80]
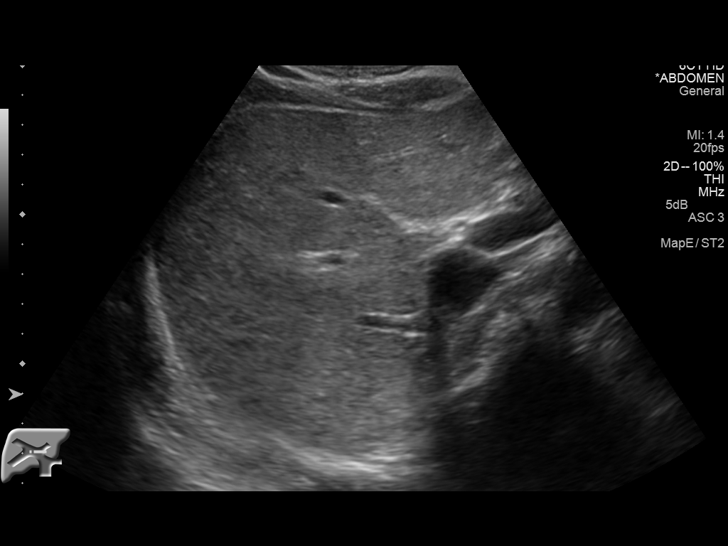
[im 37/80]
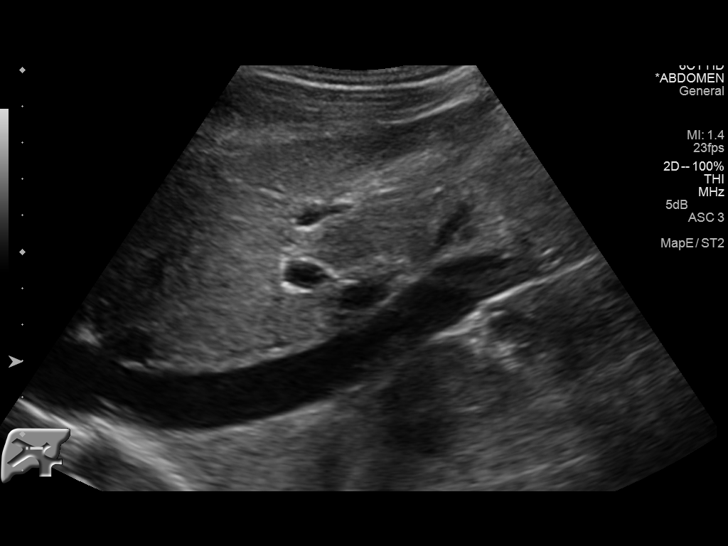
[im 43/80]
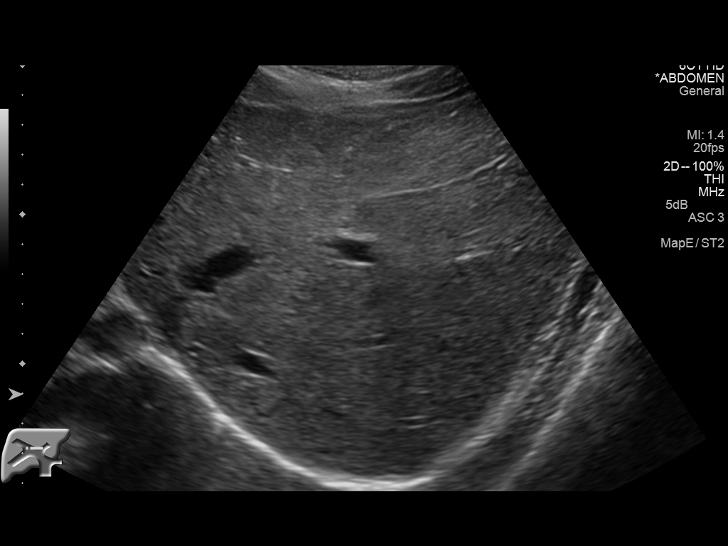
[im 50/80]
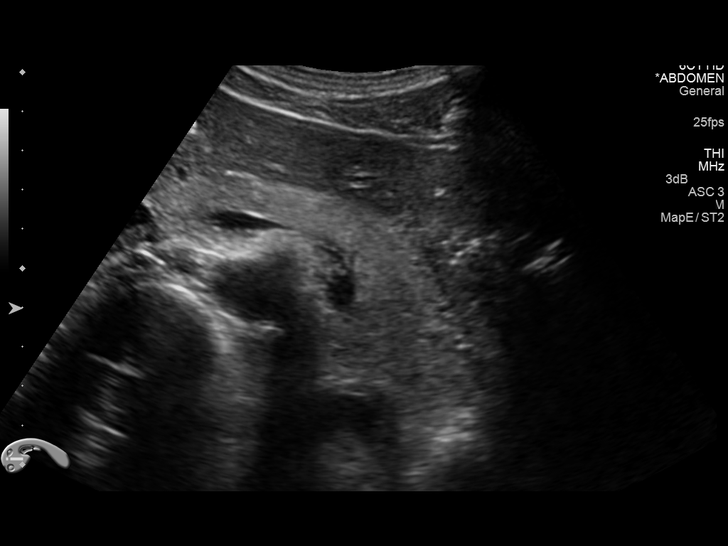
[im 53/80]
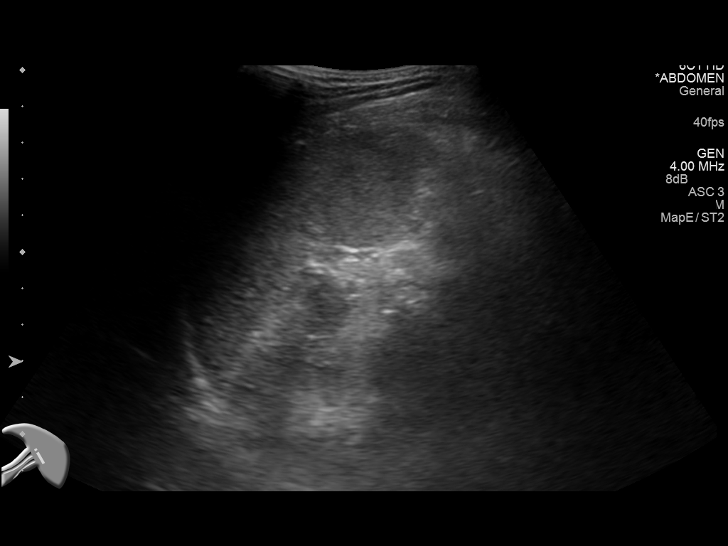
[im 60/80]
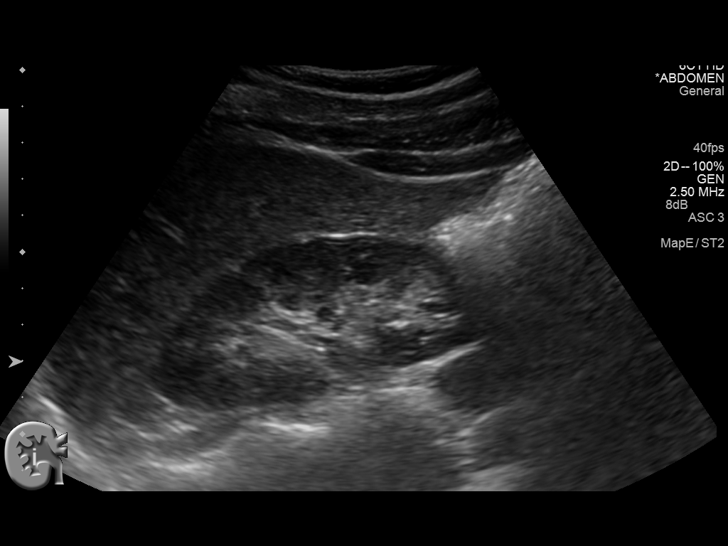
[im 66/80]
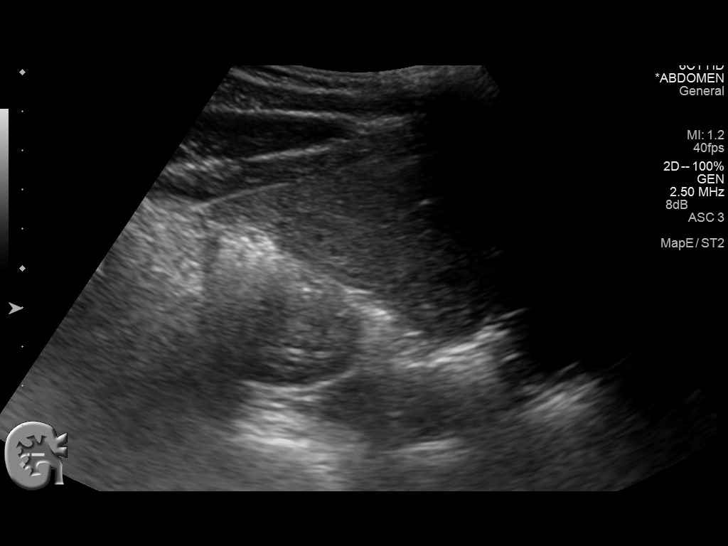
[im 73/80]
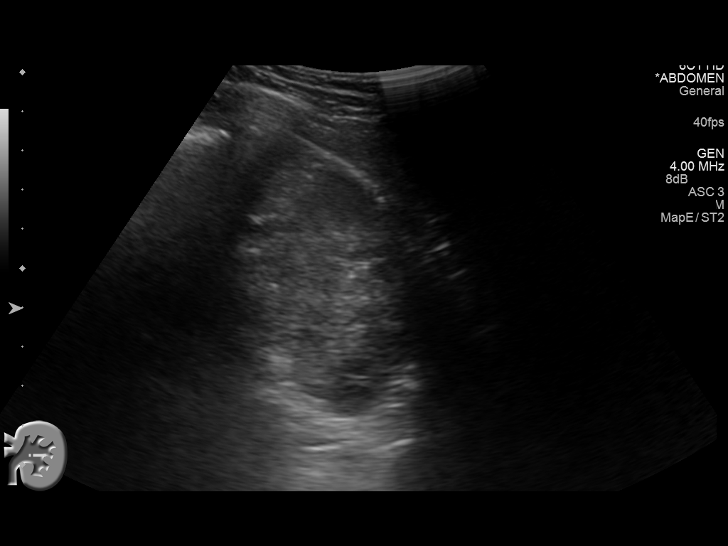
[im 80/80]
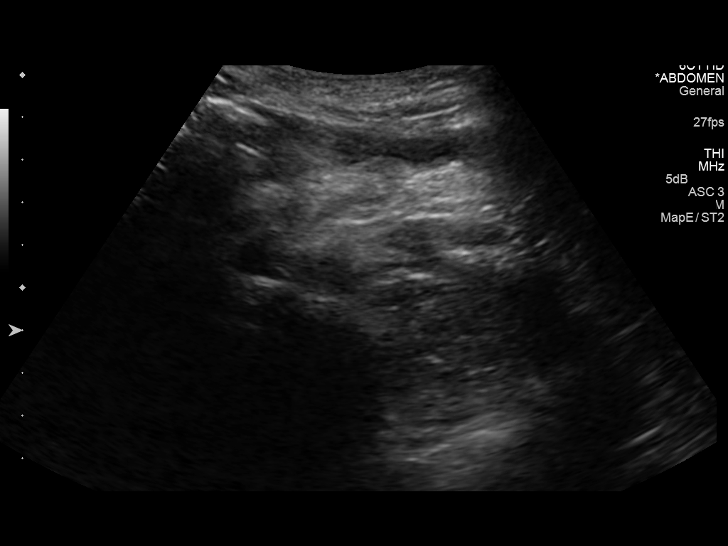

[14 of 25 positions shown; findings below may reference images not displayed]

FINDINGS: Gallbladder: No gallstones or wall thickening visualized. No
sonographic Murphy sign noted.

Common bile duct: Diameter: 0.3 cm.

Liver: No focal lesion identified. Within normal limits in
parenchymal echogenicity.

IVC: No abnormality visualized.

Pancreas: Visualized portion unremarkable.

Spleen: Size and appearance within normal limits.

Right Kidney: Length: 9.3 cm. Echogenicity within normal limits. No
mass or hydronephrosis visualized.

Left Kidney: Length: 9.6 cm. Echogenicity within normal limits. No
mass or hydronephrosis visualized.

Abdominal aorta: No aneurysm visualized.

Other findings: None.
IMPRESSION: Normal exam.
